# Patient Record
Sex: Male | Born: 1990 | Hispanic: No | Marital: Single | Smoking: Former smoker
Health system: Southern US, Community
[De-identification: ages and names within clinical notes are randomized; demographics above are authoritative.]

## PROBLEM LIST (undated history)

## (undated) DIAGNOSIS — T7840XA Allergy, unspecified, initial encounter: Secondary | ICD-10-CM

## (undated) HISTORY — DX: Allergy, unspecified, initial encounter: T78.40XA

## (undated) HISTORY — PX: WISDOM TOOTH EXTRACTION: SHX21

---

## 2012-05-25 ENCOUNTER — Ambulatory Visit (INDEPENDENT_AMBULATORY_CARE_PROVIDER_SITE_OTHER): Payer: BC Managed Care – PPO | Admitting: Physician Assistant

## 2012-05-25 VITALS — BP 112/76 | HR 60 | Temp 98.2°F | Resp 16 | Ht 66.0 in | Wt 180.0 lb

## 2012-05-25 DIAGNOSIS — L309 Dermatitis, unspecified: Secondary | ICD-10-CM

## 2012-05-25 DIAGNOSIS — L259 Unspecified contact dermatitis, unspecified cause: Secondary | ICD-10-CM

## 2012-05-25 MED ORDER — CLOBETASOL PROPIONATE 0.05 % EX CREA: TOPICAL_CREAM | Freq: Two times a day (BID) | CUTANEOUS | Status: DC

## 2012-05-25 NOTE — Progress Notes (Signed)
  Subjective:    Patient ID: Jermaine Griffin, male    DOB: Aug 02, 1990, 22 y.o.   MRN: [id number]  HPI This 22 y.o. male presents for evaluation of rash on both ankles/lower legs x 5 days.  Looks like poison ivy he's had previously, but in a different distribution. This seems more "scattered," and only on the fronts of both legs, rather than in clusters he's had previously.   Past Medical History  Diagnosis Date  . Allergy     Past Surgical History  Procedure Laterality Date  . Wisdom tooth extraction      Prior to Admission medications   Medication Sig Start Date End Date Taking? Authorizing Provider  Cetirizine-Pseudoephedrine (ZYRTEC-D PO) Take by mouth daily.   Yes Historical Provider, MD    No Known Allergies  History   Social History  . Marital Status: Single    Spouse Name: n/a    Number of Children: 0  . Years of Education: 16   Occupational History  . lacrosse coach     [employer]   Social History Main Topics  . Smoking status: Light Tobacco Smoker  . Smokeless tobacco: Former Neurosurgeon  . Alcohol Use: 2.0 oz/week    4 drink(s) per week  . Drug Use: No  . Sexually Active: Not on file   Other Topics Concern  . Not on file   Social History Narrative   [employer] Graduate 05/2012; family is in [place] and [place].    History reviewed. No pertinent family history.   Review of Systems As above.    Objective:   Physical Exam Blood pressure 112/76, pulse 60, temperature 98.2 F (36.8 C), temperature source Oral, resp. rate 16, height 5\' 6"  (1.676 m), weight 180 lb (81.647 kg), SpO2 100.00%. Body mass index is 29.07 kg/(m^2). Well-developed, well nourished male who is awake, alert and oriented, in NAD. HEENT: /AT, sclera and conjunctiva are clear.   Heart: regular rate Lungs: normal effort Extremities: no cyanosis, clubbing or edema. Skin: warm and dry with scattered dermatitis on the anterior aspects of both lower legs/ankles.  Vesicular  lesions, most excoriated, all small (2-3 mm). Psychologic: good mood and appropriate affect, normal speech and behavior.     Assessment & Plan:  Dermatitis - Plan: clobetasol cream (TEMOVATE) 0.05 %  Patient Instructions  Continue taking Zyrtec daily (consider switching to a non-D product). You may add diphenhydramine (Benadryl) at bedtime, if needed, for itching that prevents you from sleeping. Drink at least 64 ounces of water daily.   Fernande Bras, PA-C Physician Assistant-Certified [facility]

## 2012-05-25 NOTE — Patient Instructions (Signed)
Continue taking Zyrtec daily (consider switching to a non-D product). You may add diphenhydramine (Benadryl) at bedtime, if needed, for itching that prevents you from sleeping. Drink at least 64 ounces of water daily.

## 2014-05-30 ENCOUNTER — Ambulatory Visit (INDEPENDENT_AMBULATORY_CARE_PROVIDER_SITE_OTHER): Payer: 59 | Admitting: Physician Assistant

## 2014-05-30 VITALS — BP 110/68 | HR 50 | Temp 98.1°F | Resp 18 | Ht 67.0 in | Wt 176.0 lb

## 2014-05-30 DIAGNOSIS — J309 Allergic rhinitis, unspecified: Secondary | ICD-10-CM | POA: Diagnosis not present

## 2014-05-30 DIAGNOSIS — J069 Acute upper respiratory infection, unspecified: Secondary | ICD-10-CM

## 2014-05-30 DIAGNOSIS — B9789 Other viral agents as the cause of diseases classified elsewhere: Principal | ICD-10-CM

## 2014-05-30 MED ORDER — BECLOMETHASONE DIPROPIONATE 40 MCG/ACT IN AERS: 1.0000 | INHALATION_SPRAY | Freq: Two times a day (BID) | RESPIRATORY_TRACT | Status: DC

## 2014-05-30 MED ORDER — HYDROCOD POLST-CPM POLST ER 10-8 MG/5ML PO SUER: 5.0000 mL | Freq: Two times a day (BID) | ORAL | Status: DC | PRN

## 2014-05-30 MED ORDER — IPRATROPIUM BROMIDE 0.03 % NA SOLN: 2.0000 | Freq: Two times a day (BID) | NASAL | Status: DC

## 2014-05-30 NOTE — Patient Instructions (Signed)
Use steroid inhaler twice a day. Ask pharmacist to show you how to use it. Rinse your mouth out after each use. Use cough syrup at night for sleep. atrovent nasal spray twice a day for nasal congestion. Continue allergy medication. Stop cold medicine you were taking at home.  If your symptoms worsen, let me know. Otherwise, I expect this to get better over the next few weeks.

## 2014-05-30 NOTE — Progress Notes (Signed)
Subjective:    Patient ID: Jermaine Griffin, male    DOB: 12/04/1990, 24 y.o.   MRN: [id number]  HPI  This is a 24 year old male with PMH allergic rhinitis who is presenting with 1 week of cough, sore throat and nasal congestion. Cough is dry and worse in the morning. Nasal congestion also worse in the mornings. Last night was waking up from the cough. Reports he does not feel bad and symptoms are generally staying the same since onset. Denies sinus pressure, otalgia, fever, chills, SOB or wheezing. Taking allergy and cold medicine. Denies lung disease and not a smoker.   Review of Systems  Constitutional: Negative for fever and chills.  HENT: Positive for congestion and sore throat. Negative for ear pain and sinus pressure.   Eyes: Negative for redness.  Respiratory: Positive for cough. Negative for shortness of breath and wheezing.   Gastrointestinal: Negative for nausea and vomiting.  Skin: Negative for rash.  Allergic/Immunologic: Positive for environmental allergies.  Hematological: Negative for adenopathy.  Psychiatric/Behavioral: Positive for sleep disturbance.    There are no active problems to display for this patient.  Prior to Admission medications   Medication Sig Start Date End Date Taking? Authorizing Provider  Cetirizine-Pseudoephedrine (ZYRTEC-D PO) Take by mouth daily.    Historical Provider, MD  clobetasol cream (TEMOVATE) 0.05 % Apply topically 2 (two) times daily. Patient not taking: Reported on 05/30/2014 05/25/12   Fernande Brashelle S Jeffery, PA-C   No Known Allergies  Patient's social and family history were reviewed.     Objective:   Physical Exam  Constitutional: He is oriented to person, place, and time. He appears well-developed and well-nourished. No distress.  HENT:  Head: Normocephalic and atraumatic.  Right Ear: Hearing, tympanic membrane, external ear and ear canal normal.  Left Ear: Hearing, tympanic membrane, external ear and ear canal normal.  Nose: Mucosal  edema present. Right sinus exhibits no maxillary sinus tenderness and no frontal sinus tenderness. Left sinus exhibits no maxillary sinus tenderness and no frontal sinus tenderness.  Mouth/Throat: Uvula is midline and mucous membranes are normal. Posterior oropharyngeal erythema present. No oropharyngeal exudate or posterior oropharyngeal edema.  Eyes: Conjunctivae and lids are normal. Right eye exhibits no discharge. Left eye exhibits no discharge. No scleral icterus.  Cardiovascular: Normal rate, regular rhythm, normal heart sounds and normal pulses.   No murmur heard. Pulmonary/Chest: Effort normal and breath sounds normal. No respiratory distress. He has no wheezes. He has no rhonchi. He has no rales.  Musculoskeletal: Normal range of motion.  Lymphadenopathy:       Head (right side): No submental, no submandibular and no tonsillar adenopathy present.       Head (left side): No submental, no submandibular and no tonsillar adenopathy present.    He has no cervical adenopathy.  Neurological: He is alert and oriented to person, place, and time.  Skin: Skin is warm, dry and intact. No lesion and no rash noted.  Psychiatric: He has a normal mood and affect. His speech is normal and behavior is normal. Thought content normal.   BP 110/68 mmHg  Pulse 50  Temp(Src) 98.1 F (36.7 C) (Oral)  Resp 18  Ht 5\' 7"  (1.702 m)  Wt 176 lb (79.833 kg)  BMI 27.56 kg/m2  SpO2 98%     Assessment & Plan:  1. Viral URI with cough 2. Allergic rhinitis Possible allergic rhinitis or viral etiology or combination of both. Focus is on supportive care, see meds prescribed below.  He will continue daily allergy pill. If symptoms are not improving in 1-2 weeks he will return for follow up.  - beclomethasone (QVAR) 40 MCG/ACT inhaler; Inhale 1 puff into the lungs 2 (two) times daily.  Dispense: 1 Inhaler; Refill: 0 - ipratropium (ATROVENT) 0.03 % nasal spray; Place 2 sprays into both nostrils 2 (two) times daily.   Dispense: 30 mL; Refill: 0 - chlorpheniramine-HYDROcodone (TUSSIONEX PENNKINETIC ER) 10-8 MG/5ML SUER; Take 5 mLs by mouth every 12 (twelve) hours as needed for cough.  Dispense: 100 mL; Refill: 0   Roswell MinersNicole V. Dyke BrackettBush, PA-C, MHS [facility]  05/30/2014

## 2015-08-24 ENCOUNTER — Telehealth: Payer: Self-pay | Admitting: Pharmacist Clinician (PhC)/ Clinical Pharmacy Specialist

## 2015-08-24 NOTE — Telephone Encounter (Signed)
Got a note that Maximas wasn't interested in the PreP study. Called him to see if he wants to use traditional PreP. Left a voicemail to call back.

## 2016-07-10 DIAGNOSIS — Z0000 Encounter for general adult medical examination without abnormal findings: Secondary | ICD-10-CM | POA: Diagnosis not present

## 2016-07-10 DIAGNOSIS — Z23 Encounter for immunization: Secondary | ICD-10-CM | POA: Diagnosis not present

## 2016-08-13 DIAGNOSIS — Z79899 Other long term (current) drug therapy: Secondary | ICD-10-CM | POA: Diagnosis not present

## 2016-11-14 DIAGNOSIS — S3992XA Unspecified injury of lower back, initial encounter: Secondary | ICD-10-CM | POA: Diagnosis not present

## 2016-11-14 DIAGNOSIS — M545 Low back pain: Secondary | ICD-10-CM | POA: Diagnosis not present

## 2018-12-07 ENCOUNTER — Other Ambulatory Visit: Payer: Self-pay | Admitting: Family Medicine

## 2018-12-07 DIAGNOSIS — R55 Syncope and collapse: Secondary | ICD-10-CM

## 2018-12-08 ENCOUNTER — Other Ambulatory Visit (HOSPITAL_COMMUNITY): Payer: Self-pay | Admitting: Family Medicine

## 2018-12-08 ENCOUNTER — Ambulatory Visit (INDEPENDENT_AMBULATORY_CARE_PROVIDER_SITE_OTHER): Payer: 59

## 2018-12-08 ENCOUNTER — Other Ambulatory Visit: Payer: Self-pay

## 2018-12-08 DIAGNOSIS — R55 Syncope and collapse: Secondary | ICD-10-CM

## 2018-12-10 ENCOUNTER — Other Ambulatory Visit (HOSPITAL_COMMUNITY): Payer: 59

## 2018-12-11 ENCOUNTER — Encounter (HOSPITAL_COMMUNITY): Payer: Self-pay | Admitting: Family Medicine

## 2018-12-14 ENCOUNTER — Ambulatory Visit: Payer: 59 | Admitting: Neurology

## 2019-02-09 ENCOUNTER — Ambulatory Visit: Payer: 59 | Admitting: Diagnostic Neuroimaging

## 2019-02-09 ENCOUNTER — Encounter: Payer: Self-pay | Admitting: *Deleted

## 2019-02-09 ENCOUNTER — Other Ambulatory Visit: Payer: Self-pay

## 2019-02-09 VITALS — BP 107/66 | HR 54 | Temp 97.9°F | Ht 66.0 in | Wt 164.6 lb

## 2019-02-09 DIAGNOSIS — R55 Syncope and collapse: Secondary | ICD-10-CM | POA: Diagnosis not present

## 2019-02-09 NOTE — Patient Instructions (Signed)
RECURRENT SYNCOPE (likely vasovagal; triggered by stomach pain / bowel movement)  - neuro exam and CT normal; unlikely to be seizure  - monitor and follow up with PCP

## 2019-02-09 NOTE — Progress Notes (Signed)
[facility]  PATIENT: Jermaine Griffin DOB: 30-Jun-1990  REFERRING CLINICIAN: Juluis Rainier, MD HISTORY FROM: patient  REASON FOR VISIT: new consult    HISTORICAL  CHIEF COMPLAINT:  Chief Complaint  Patient presents with  . Syncope, ? seizure    rm 7, New Pt "3 months apart with syncopal events, Aug/ Nov; sweating, off balance, difficulty breathing, passed out; no episodes since"    HISTORY OF PRESENT ILLNESS:   29 year old male here for evaluation of syncope.  August 2020 patient was at home, feeling constipated and went to the bathroom.  He then started to feel sweating sensation.  He stood up and then vision faded out.  He lost consciousness for a few seconds.  When he woke up he felt a little bit short of breath and panic sensation.  Within a few minutes he felt back to baseline.  November 2020 patient was sitting at home, felt stomach pains like he needed to use the bathroom.  This time he went into the shower, felt very cold, sweaty, grabbed at the shower curtain rod and fell down and passed out for a few seconds.  Patient was able to stand up within a few minutes and returned to baseline within 10 minutes.  With both episodes no tongue biting or incontinence.  No postictal confusion.  No prior similar events.  Patient has been under increased stress in the last few months related to pandemic, work, emotional and other factors.  Patient is very active physically with work, coaching, exercise.  He has been active and exercise and sports for most of his life.  He has never had the symptoms occur during heavy exertion.    REVIEW OF SYSTEMS: Full 14 system review of systems performed and negative with exception of: As per HPI.  ALLERGIES: No Known Allergies  HOME MEDICATIONS: Outpatient Medications Prior to Visit  Medication Sig Dispense Refill  . Cetirizine-Pseudoephedrine (ZYRTEC-D PO) Take by mouth daily.    . Multiple Vitamin (MULTIVITAMIN) capsule  Take 1 capsule by mouth daily.    . beclomethasone (QVAR) 40 MCG/ACT inhaler Inhale 1 puff into the lungs 2 (two) times daily. 1 Inhaler 0  . chlorpheniramine-HYDROcodone (TUSSIONEX PENNKINETIC ER) 10-8 MG/5ML SUER Take 5 mLs by mouth every 12 (twelve) hours as needed for cough. 100 mL 0  . ipratropium (ATROVENT) 0.03 % nasal spray Place 2 sprays into both nostrils 2 (two) times daily. 30 mL 0   No facility-administered medications prior to visit.    PAST MEDICAL HISTORY: Past Medical History:  Diagnosis Date  . Allergy     PAST SURGICAL HISTORY: Past Surgical History:  Procedure Laterality Date  . WISDOM TOOTH EXTRACTION      FAMILY HISTORY: Family History  Adopted: Yes    SOCIAL HISTORY: Social History   Socioeconomic History  . Marital status: Single    Spouse name: n/a  . Number of children: 0  . Years of education: 16  . Highest education level: Bachelor's degree (e.g., BA, AB, BS)  Occupational History  . 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 Smoking status: Former Smoker    Quit date: 02/08/2014    Years since quitting: 5.0  . Smokeless tobacco: Former Engineer, water and Sexual Activity  . Alcohol use: Yes    Alcohol/week: 5.0 standard drinks    Types: 5 Standard drinks or equivalent per week    Comment: social  .  Drug use: No  . Sexual activity: Not on file  Other Topics Concern  . Not on file  Social History Narrative   [employer] Graduate 05/2012; family is in [place] and [place].   Caffeine- daily energy drink or [employer]   Social Determinants of Health   Financial Resource Strain:   . Difficulty of Paying Living Expenses: Not on file  Food Insecurity:   . Worried About Charity fundraiser in the Last Year: Not on file  . Ran Out of Food in the Last Year: Not on file  Transportation Needs:   . Lack of Transportation (Medical): Not on file  . Lack of  Transportation (Non-Medical): Not on file  Physical Activity:   . Days of Exercise per Week: Not on file  . Minutes of Exercise per Session: Not on file  Stress:   . Feeling of Stress : Not on file  Social Connections:   . Frequency of Communication with Friends and Family: Not on file  . Frequency of Social Gatherings with Friends and Family: Not on file  . Attends Religious Services: Not on file  . Active Member of Clubs or Organizations: Not on file  . Attends Archivist Meetings: Not on file  . Marital Status: Not on file  Intimate Partner Violence:   . Fear of Current or Ex-Partner: Not on file  . Emotionally Abused: Not on file  . Physically Abused: Not on file  . Sexually Abused: Not on file     PHYSICAL EXAM  GENERAL EXAM/CONSTITUTIONAL: Vitals:  Vitals:   02/09/19 1242  BP: 107/66  Pulse: (!) 54  Temp: 97.9 F (36.6 C)  Weight: 164 lb 9.6 oz (74.7 kg)  Height: 5\' 6"  (1.676 m)     Body mass index is 26.57 kg/m. Wt Readings from Last 3 Encounters:  02/09/19 164 lb 9.6 oz (74.7 kg)  05/30/14 176 lb (79.8 kg)  05/25/12 180 lb (81.6 kg)     Patient is in no distress; well developed, nourished and groomed; neck is supple  CARDIOVASCULAR:  Examination of carotid arteries is normal; no carotid bruits  Regular rate and rhythm, no murmurs  Examination of peripheral vascular system by observation and palpation is normal  EYES:  Ophthalmoscopic exam of optic discs and posterior segments is normal; no papilledema or hemorrhages  No exam data present  MUSCULOSKELETAL:  Gait, strength, tone, movements noted in Neurologic exam below  NEUROLOGIC: MENTAL STATUS:  No flowsheet data found.  awake, alert, oriented to person, place and time  recent and remote memory intact  normal attention and concentration  language fluent, comprehension intact, naming intact  fund of knowledge appropriate  CRANIAL NERVE:   2nd - no papilledema on  fundoscopic exam  2nd, 3rd, 4th, 6th - pupils equal and reactive to light, visual fields full to confrontation, extraocular muscles intact, no nystagmus  5th - facial sensation symmetric  7th - facial strength symmetric  8th - hearing intact  9th - palate elevates symmetrically, uvula midline  11th - shoulder shrug symmetric  12th - tongue protrusion midline  MOTOR:   normal bulk and tone, full strength in the BUE, BLE  SENSORY:   normal and symmetric to light touch, temperature, vibration  COORDINATION:   finger-nose-finger, fine finger movements normal  REFLEXES:   deep tendon reflexes present and symmetric; EXCEPT DECR IN RIGHT KNEE  GAIT/STATION:   narrow based gait     DIAGNOSTIC DATA (LABS, IMAGING, TESTING) - I  reviewed patient records, labs, notes, testing and imaging myself where available.  No results found for: WBC, HGB, HCT, MCV, PLT No results found for: NA, K, CL, CO2, GLUCOSE, BUN, CREATININE, CALCIUM, PROT, ALBUMIN, AST, ALT, ALKPHOS, BILITOT, GFRNONAA, GFRAA No results found for: CHOL, HDL, LDLCALC, LDLDIRECT, TRIG, CHOLHDL No results found for: UYQI3K No results found for: VITAMINB12 No results found for: TSH   12/08/18 CT head [I reviewed images myself and agree with interpretation. -VRP]  - normal    ASSESSMENT AND PLAN  29 y.o. year old male here with recurrent syncope attacks, likely vasovagal events triggered by stomach pain and bowel movement sensation.  By description these are unlikely be seizure.  Neuro exam and CT scan are unremarkable.  Recommend monitoring and follow-up with PCP.     Dx:  1. Recurrent syncope     PLAN:  RECURRENT SYNCOPE (likely vasovagal; triggered by stomach pain / bowel movement) - neuro exam and CT normal; unlikely to be seizure - monitor and follow up with PCP; follow up echocardiogram (ordered by PCP)  Return for pending if symptoms worsen or fail to improve, return to PCP.    Suanne Marker, MD 02/09/2019, 1:41 PM Certified in Neurology, Neurophysiology and Neuroimaging  [facility] [address], [address] [place], [place] [address] [phone]

## 2020-06-05 IMAGING — CT CT HEAD W/O CM
3 series · 16 of 47 positions shown, 19 images · non-contrast
Comparison: None.

CLINICAL DATA: Syncope

EXAM:
CT HEAD WITHOUT CONTRAST
TECHNIQUE: Contiguous axial images were obtained from the base of the skull
through the vertex without intravenous contrast.

[Series 2: head wo · axial · 0.43mm/px · z∈[-129,+1]mm · 10 of 32 slices shown, 13 images]
[im 3/32  brain]
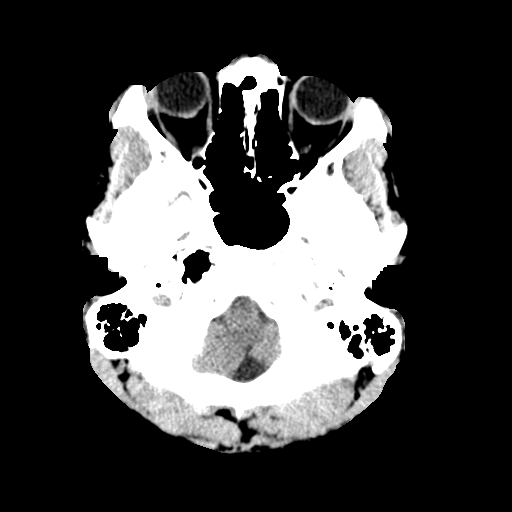
[im 3/32  bone]
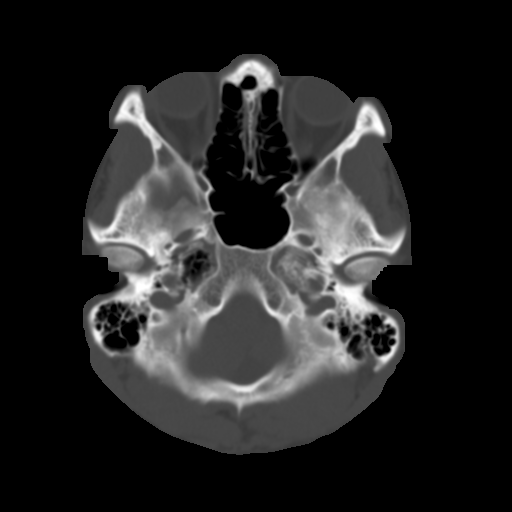
[im 6/32  brain]
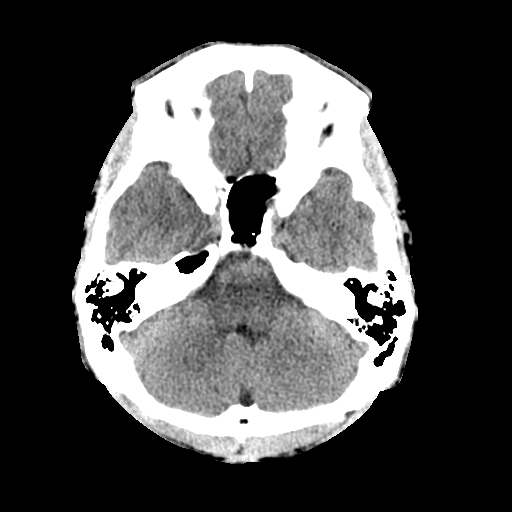
[im 9/32  brain]
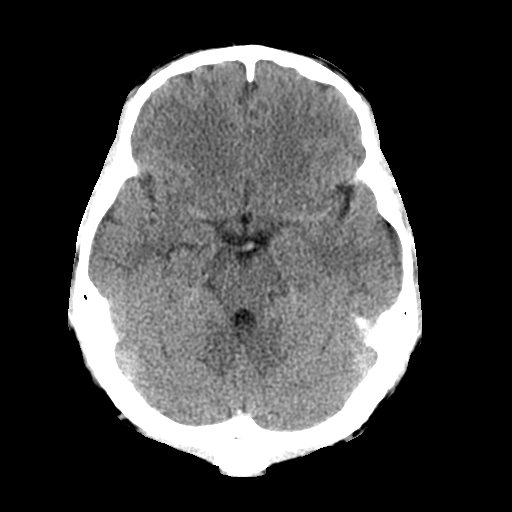
[im 11/32  brain]
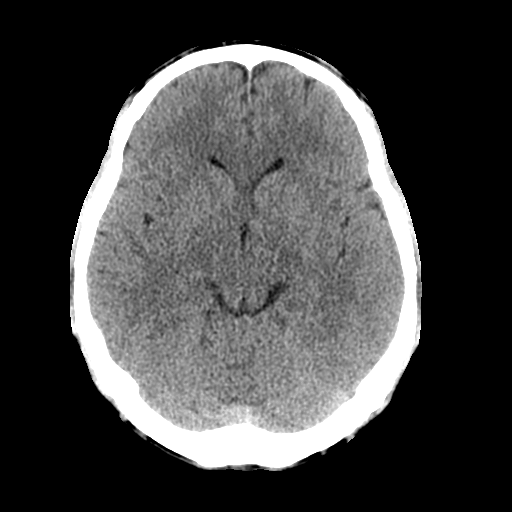
[im 14/32  brain]
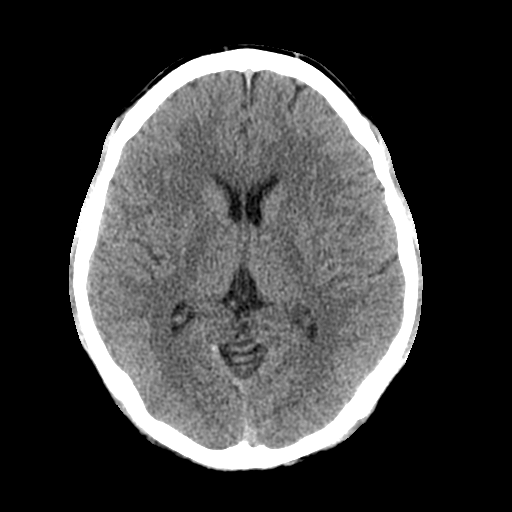
[im 14/32  bone]
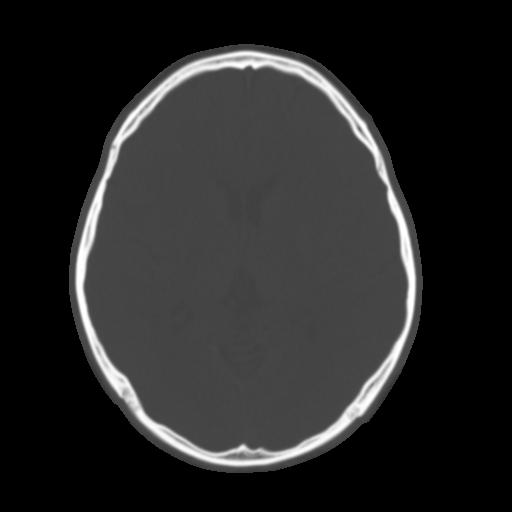
[im 18/32  brain]
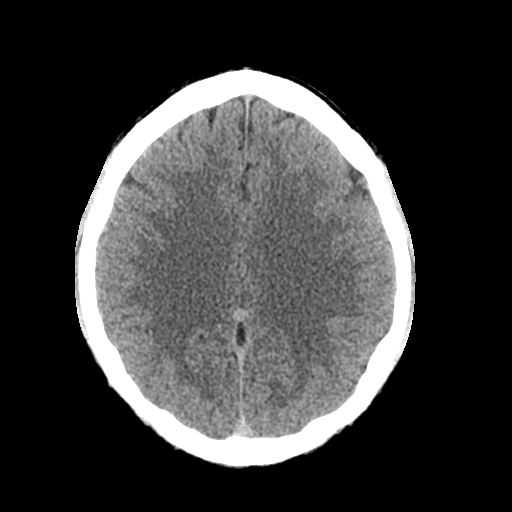
[im 21/32  brain]
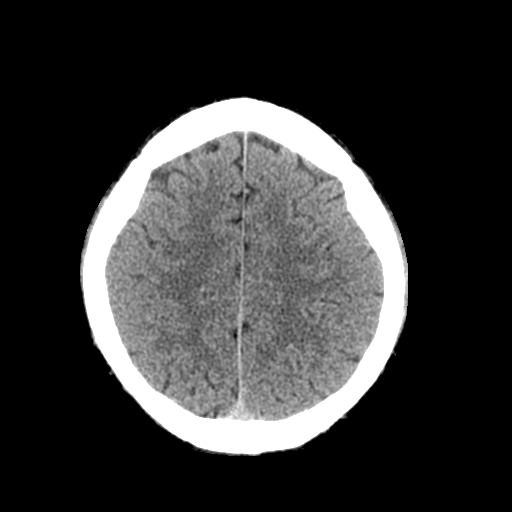
[im 24/32  brain]
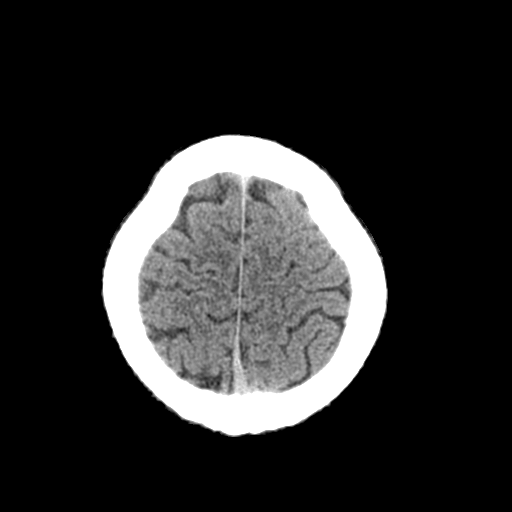
[im 26/32  brain]
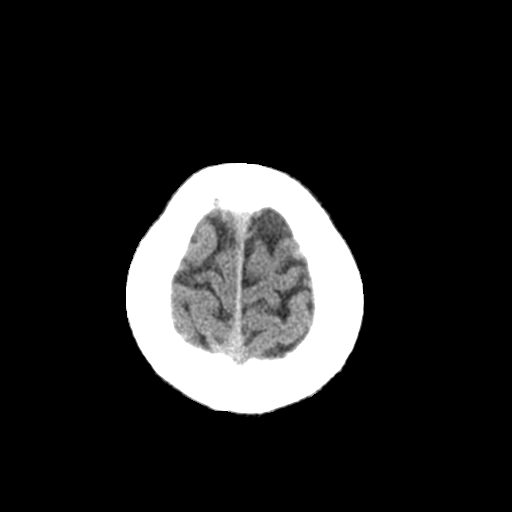
[im 26/32  bone]
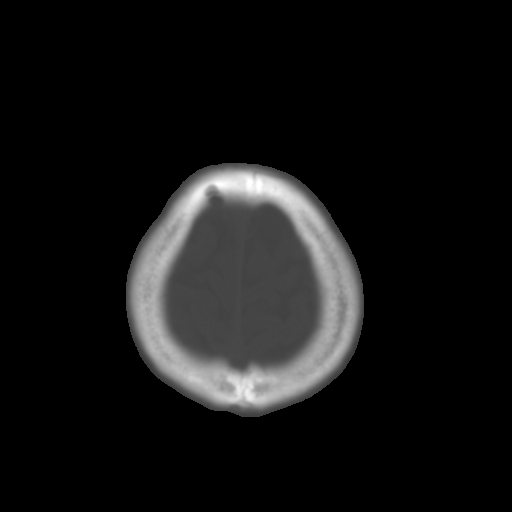
[im 29/32  brain]
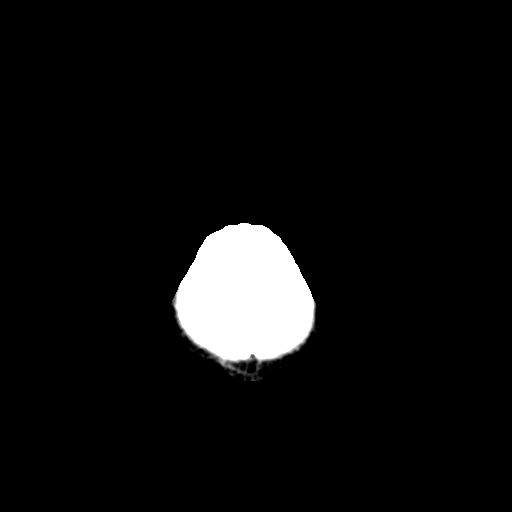

[Series 4: head wo coronal · coronal · 0.31mm/px · 3 of 67 slices shown]
[im 23/67  brain]
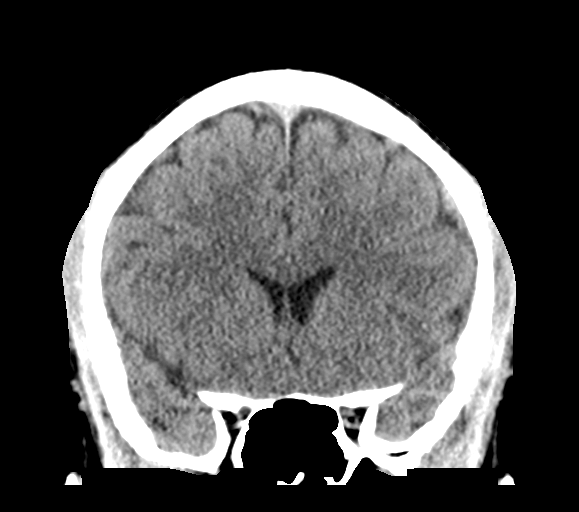
[im 30/67  brain]
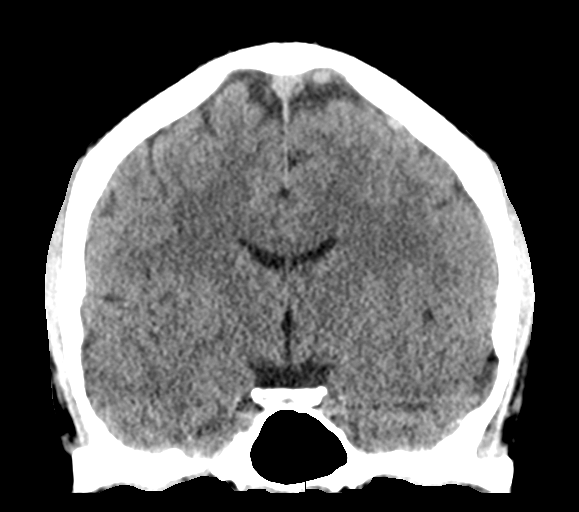
[im 37/67  brain]
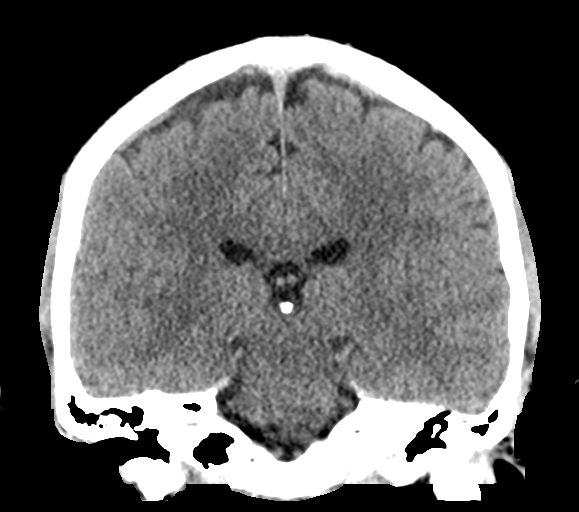

[Series 5: head wo sagittal · sagittal · 0.34mm/px · 3 of 57 slices shown]
[im 19/57  brain]
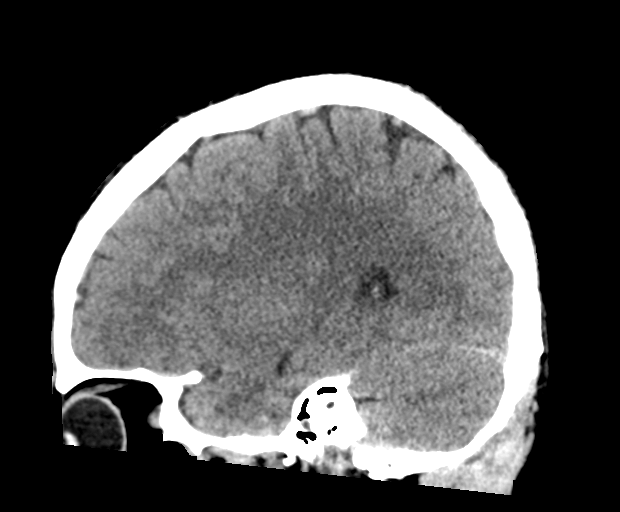
[im 29/57  brain]
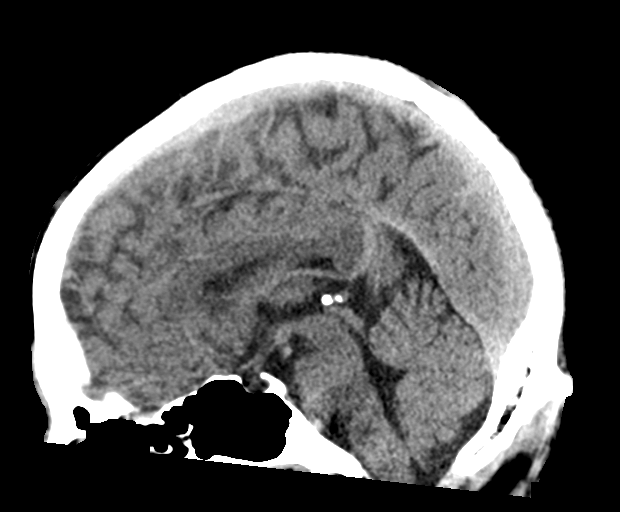
[im 38/57  brain]
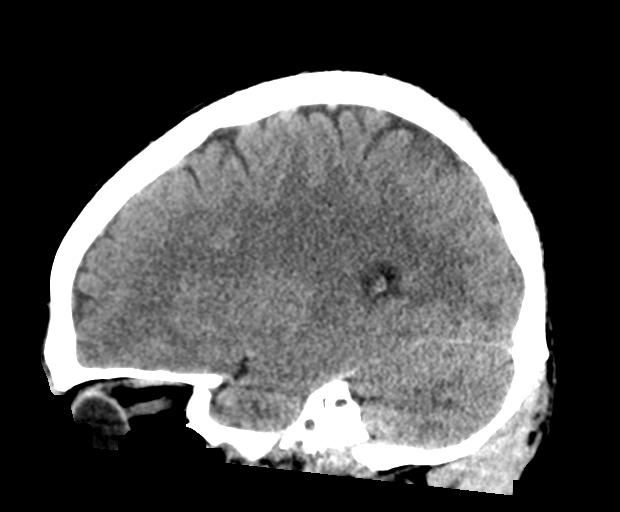

[16 of 47 positions shown; findings below may reference images not displayed]

FINDINGS: Brain: No acute intracranial abnormality. Specifically, no
hemorrhage, hydrocephalus, mass lesion, acute infarction, or
significant intracranial injury.

Vascular: No hyperdense vessel or unexpected calcification.

Skull: No acute calvarial abnormality.

Sinuses/Orbits: Visualized paranasal sinuses and mastoids clear.
Orbital soft tissues unremarkable.

Other: None
IMPRESSION: Normal study.
